# Patient Record
Sex: Female | Born: 1970 | Race: White | Hispanic: Yes | Smoking: Never smoker
Health system: Southern US, Community
[De-identification: ages and names within clinical notes are randomized; demographics above are authoritative.]

## PROBLEM LIST (undated history)

## (undated) DIAGNOSIS — I10 Essential (primary) hypertension: Secondary | ICD-10-CM

## (undated) DIAGNOSIS — E119 Type 2 diabetes mellitus without complications: Secondary | ICD-10-CM

## (undated) HISTORY — DX: Essential (primary) hypertension: I10

## (undated) HISTORY — DX: Type 2 diabetes mellitus without complications: E11.9

---

## 1994-12-15 HISTORY — PX: CHOLECYSTECTOMY: SHX55

## 2010-12-15 HISTORY — PX: THYROID SURGERY: SHX805

## 2021-12-26 ENCOUNTER — Other Ambulatory Visit: Payer: Self-pay | Admitting: Obstetrics and Gynecology

## 2021-12-26 DIAGNOSIS — Z1231 Encounter for screening mammogram for malignant neoplasm of breast: Secondary | ICD-10-CM

## 2022-02-12 ENCOUNTER — Ambulatory Visit
Admission: RE | Admit: 2022-02-12 | Discharge: 2022-02-12 | Disposition: A | Discharge status: Home / Self Care | Payer: No Typology Code available for payment source | Source: Ambulatory Visit | Attending: Obstetrics and Gynecology | Admitting: Obstetrics and Gynecology

## 2022-02-12 ENCOUNTER — Other Ambulatory Visit: Payer: Self-pay

## 2022-02-12 DIAGNOSIS — Z1231 Encounter for screening mammogram for malignant neoplasm of breast: Secondary | ICD-10-CM

## 2022-02-17 ENCOUNTER — Other Ambulatory Visit: Payer: Self-pay | Admitting: Obstetrics and Gynecology

## 2022-02-17 DIAGNOSIS — R928 Other abnormal and inconclusive findings on diagnostic imaging of breast: Secondary | ICD-10-CM

## 2022-03-20 ENCOUNTER — Inpatient Hospital Stay: Payer: Self-pay | Attending: Hematology and Oncology | Admitting: Hematology and Oncology

## 2022-03-20 VITALS — BP 125/70 | HR 98 | Temp 98.0°F | Resp 18 | Ht 59.0 in | Wt 142.9 lb

## 2022-03-20 DIAGNOSIS — Z124 Encounter for screening for malignant neoplasm of cervix: Secondary | ICD-10-CM

## 2022-03-21 NOTE — Progress Notes (Signed)
Kelly Owens is a 51 y.o. No obstetric history on file. female who presents to [facility] clinic today with no complaints right breast with calcification noted on mammogram.  ?  ?Pap Smear: Pap smear completed today. Last Pap smear was 2020 and was normal. Per patient has no history of an abnormal Pap smear. Last Pap smear result is not available in Epic. ?  ?Physical exam: ?Breasts ?Breasts symmetrical. No skin abnormalities bilateral breasts. No nipple retraction bilateral breasts. No nipple discharge bilateral breasts. No lymphadenopathy. No lumps palpated bilateral breasts.     ?  ?Pelvic/Bimanual ?Ext Genitalia ?No lesions, no swelling and no discharge observed on external genitalia.      ?  ?Vagina ?Vagina pink and normal texture. No lesions or discharge observed in vagina.      ?  ?Cervix ?Cervix is present. Cervix pink and of normal texture. No discharge observed.  ?  ?Uterus ?Uterus is present and palpable. Uterus in normal position and normal size.      ?  ?Adnexae ?Bilateral ovaries present and palpable. No tenderness on palpation.       ?  ?Rectovaginal ?No rectal exam completed today since patient had no rectal complaints. No skin abnormalities observed on exam.   ?  ?Smoking History: ?Patient has never smoked and was not referred to quit line.  ?  ?Patient Navigation: ?Patient education provided. Access to services provided for patient through BCCCP program. A [facility] interpreter provided. No transportation provided  ? ?Colorectal Cancer Screening: ?Per patient has never had colonoscopy completed No complaints today.  ?  ?Breast and Cervical Cancer Risk Assessment: ?Patient does not have family history of breast cancer, known genetic mutations, or radiation treatment to the chest before age 11. Patient does not have history of cervical dysplasia, immunocompromised, or DES exposure in-utero. ? ?Risk Assessment   ?No risk assessment data ?  ? ? ?A: ?BCCCP exam with pap smear ?Complaint of right  calcification noted on mammogram. ? ?P: ?[facility]. Appointment scheduled. ? ?Pascal Lux, NP ?03/21/2022 8:20 AM   ?

## 2022-03-25 LAB — CYTOLOGY - PAP
Comment: NEGATIVE
Diagnosis: UNDETERMINED — AB
High risk HPV: POSITIVE — AB

## 2022-04-02 ENCOUNTER — Encounter: Payer: Self-pay | Admitting: Hematology and Oncology

## 2022-04-14 ENCOUNTER — Other Ambulatory Visit: Payer: Self-pay | Admitting: Hematology and Oncology

## 2022-04-14 DIAGNOSIS — R921 Mammographic calcification found on diagnostic imaging of breast: Secondary | ICD-10-CM

## 2022-05-15 ENCOUNTER — Ambulatory Visit: Payer: No Typology Code available for payment source | Admitting: Obstetrics

## 2022-05-15 ENCOUNTER — Ambulatory Visit
Admission: RE | Admit: 2022-05-15 | Discharge: 2022-05-15 | Disposition: A | Discharge status: Home / Self Care | Payer: No Typology Code available for payment source | Source: Ambulatory Visit | Attending: Hematology and Oncology | Admitting: Hematology and Oncology

## 2022-05-15 DIAGNOSIS — R921 Mammographic calcification found on diagnostic imaging of breast: Secondary | ICD-10-CM

## 2022-05-20 ENCOUNTER — Ambulatory Visit: Payer: No Typology Code available for payment source | Admitting: Obstetrics and Gynecology

## 2022-06-27 ENCOUNTER — Ambulatory Visit: Payer: No Typology Code available for payment source | Admitting: Obstetrics

## 2022-07-09 ENCOUNTER — Ambulatory Visit: Payer: No Typology Code available for payment source | Admitting: Obstetrics and Gynecology

## 2022-08-14 ENCOUNTER — Ambulatory Visit: Payer: No Typology Code available for payment source | Admitting: Obstetrics and Gynecology

## 2022-08-22 ENCOUNTER — Ambulatory Visit (INDEPENDENT_AMBULATORY_CARE_PROVIDER_SITE_OTHER): Payer: Self-pay | Admitting: Family Medicine

## 2022-08-22 ENCOUNTER — Other Ambulatory Visit (HOSPITAL_COMMUNITY)
Admission: RE | Admit: 2022-08-22 | Discharge: 2022-08-22 | Disposition: A | Discharge status: Home / Self Care | Payer: Self-pay | Source: Ambulatory Visit | Attending: Obstetrics and Gynecology | Admitting: Obstetrics and Gynecology

## 2022-08-22 ENCOUNTER — Encounter: Payer: Self-pay | Admitting: Family Medicine

## 2022-08-22 VITALS — BP 133/77 | HR 48 | Ht 59.0 in | Wt 147.4 lb

## 2022-08-22 DIAGNOSIS — R87610 Atypical squamous cells of undetermined significance on cytologic smear of cervix (ASC-US): Secondary | ICD-10-CM | POA: Insufficient documentation

## 2022-08-22 DIAGNOSIS — Z800 Family history of malignant neoplasm of digestive organs: Secondary | ICD-10-CM

## 2022-08-22 DIAGNOSIS — R87810 Cervical high risk human papillomavirus (HPV) DNA test positive: Secondary | ICD-10-CM

## 2022-08-22 DIAGNOSIS — E119 Type 2 diabetes mellitus without complications: Secondary | ICD-10-CM | POA: Insufficient documentation

## 2022-08-22 NOTE — Progress Notes (Signed)
   GYNECOLOGY PROBLEM  VISIT ENCOUNTER NOTE  Subjective:   Kelly Owens is a 52 y.o. G7P2002  female here for a problem GYN visit.  Current complaints: here as referral from [facility] for abnormal pap.   Denies abnormal vaginal bleeding, discharge, pelvic pain, problems with intercourse or other gynecologic concerns.    Gynecologic History No LMP recorded. Patient is postmenopausal.  Contraception: post menopausal status  Health Maintenance Due  Topic Date Due   HEMOGLOBIN A1C  Never done   COVID-19 Vaccine (1) Never done   FOOT EXAM  Never done   OPHTHALMOLOGY EXAM  Never done   HIV Screening  Never done   Hepatitis C Screening  Never done   TETANUS/TDAP  Never done   COLONOSCOPY (Pts 45-7yrs Insurance coverage will need to be confirmed)  Never done   Zoster Vaccines- Shingrix (1 of 2) Never done   INFLUENZA VACCINE  Never done    The following portions of the patient's history were reviewed and updated as appropriate: allergies, current medications, past family history, past medical history, past social history, past surgical history and problem list.  Review of Systems Pertinent items are noted in HPI.   Objective:  BP 133/77   Pulse (!) 48   Ht 4\' 11"  (1.499 m)   Wt 147 lb 6.4 oz (66.9 kg)   BMI 29.77 kg/m  Gen: well appearing, NAD HEENT: no scleral icterus CV: RR Lung: Normal WOB Ext: warm well perfused  PELVIC: Normal appearing external genitalia; normal appearing vaginal mucosa and cervix.  No abnormal discharge noted. Normal uterine size, no other palpable masses, no uterine or adnexal tenderness.   51 y.o.  here for colposcopy for ASCUS with POSITIVE high risk HPV pap smear on 03/20/22. Discussed role for HPV in cervical dysplasia, need for surveillance.  Patient gave informed written consent, time out was performed.  Placed in lithotomy position. Cervix viewed with speculum and colposcope after application of acetic acid.   Colposcopy adequate? Yes   acetowhite lesion(s) noted at 12 and 3 o'clock; corresponding biopsies obtained.  ECC specimen obtained. All specimens were labeled and sent to pathology.  Chaperone was present during entire procedure.  Patient was given post procedure instructions.  Will follow up pathology and manage accordingly; patient will be contacted with results and recommendations.  Routine preventative health maintenance measures emphasized.   Assessment and Plan:  1. 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  - Surgical pathology( [place]/ [facility])  2. Family history of stomach cancer - had FOBT CRC screening - Ambulatory referral to Gastroenterology   Please refer to After Visit Summary for other counseling recommendations.   Return in about 1 year (around 08/23/2023) for Yearly wellness exam, pap.  10/23/2023, MD, MPH, ABFM Attending Physician Faculty Practice- [facility]

## 2022-08-22 NOTE — Progress Notes (Signed)
New gyn pt presents to establish care. Family hx of stomach cancer. Last PAP 03/20/2022, and was abnormal. Last mammogram June 2023 and was abnormal. No questions or concerns at this time.

## 2022-08-25 LAB — SURGICAL PATHOLOGY

## 2022-08-28 ENCOUNTER — Encounter: Payer: Self-pay | Admitting: Gastroenterology

## 2022-08-28 ENCOUNTER — Telehealth: Payer: Self-pay | Admitting: Emergency Medicine

## 2022-08-28 NOTE — Telephone Encounter (Signed)
Attempted TC x2 to home and mobile with spanish interpreter LVM.

## 2022-10-17 ENCOUNTER — Ambulatory Visit: Payer: No Typology Code available for payment source | Admitting: Gastroenterology

## 2023-01-14 ENCOUNTER — Encounter: Payer: Self-pay | Admitting: Obstetrics and Gynecology

## 2023-02-04 ENCOUNTER — Inpatient Hospital Stay: Payer: Self-pay | Attending: Hematology and Oncology | Admitting: Hematology and Oncology

## 2023-02-04 VITALS — BP 134/87 | Wt 152.6 lb

## 2023-02-04 DIAGNOSIS — R921 Mammographic calcification found on diagnostic imaging of breast: Secondary | ICD-10-CM

## 2023-02-04 NOTE — Patient Instructions (Addendum)
[place] about breast self awareness and gave educational materials to take home. Patient did not need a Pap smear today due to last Pap smear was in 2023 per patient. She is due for one year follow up of LEEP in September 2024. Let her know BCCCP will cover Pap smears every 5 years unless has a history of abnormal Pap smears. Referred patient to the [facility] for diagnostic mammogram. Patient aware of appointment and will be there. Let patient know will follow up with her within the next couple weeks with results. [place] verbalized understanding.  Melodye Ped, NP 10:26 AM

## 2023-02-04 NOTE — Progress Notes (Signed)
[name] is a 52 y.o. female who presents to [facility] clinic today with complaint of bilateral breast pain.    Pap Smear: Pap not smear completed today. Last Pap smear was 2023 and was abnormal - ASCUS/ HPV+ .  Colposcopy revealed HGSIL changes and patient had LEEP in September with recommendation for repeat Pap in one year. We will have her return in September. Per patient has no history of an abnormal Pap smear. Last Pap smear result is available in Epic.   Physical exam: Breasts Breasts symmetrical. No skin abnormalities bilateral breasts. No nipple retraction bilateral breasts. No nipple discharge bilateral breasts. No lymphadenopathy. No lumps palpated bilateral breasts.     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 This was found to be concordant by Dr. Lovey Newcomer. 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 This was found to be concordant by Dr. Lovey Newcomer. Pathology results were discussed with the patient by telephone by Kathrine Haddock, Bilingual Patient Services Representative. The patient reported doing well after the biopsies with tenderness at the sites. Post biopsy instructions and care were reviewed and questions were answered. The patient was encouraged to call [facility] for any additional concerns. The patient was instructed to return for annual screening mammography in March 2024. Pathology results reported by Terie Purser, RN on 05/16/2022. Electronically Signed   By: Lovey Newcomer M.D.   On: 05/26/2022 16:09   Result Date: 05/26/2022 CLINICAL DATA:  Patient with indeterminate  calcifications right breast 2 sites. EXAM: RIGHT BREAST STEREOTACTIC CORE NEEDLE BIOPSY COMPARISON:  [facility] FINDINGS: The patient and I discussed the procedure of stereotactic-guided biopsy including benefits and alternatives. We discussed the high likelihood of a successful procedure. We discussed the risks of the procedure including infection, bleeding, tissue injury, clip migration, and inadequate sampling. Informed written consent was given. The usual time out protocol was performed immediately prior to the procedure. Site 1: Lower inner right breast Using sterile technique and 1% Lidocaine as local anesthetic, under stereotactic guidance, a 9 gauge vacuum assisted device was used to perform core needle biopsy of calcifications lower inner right breast using a medial approach. Specimen radiograph was performed showing calcifications. Specimens with calcifications are identified for pathology. Lesion quadrant: Lower inner quadrant At the conclusion of the procedure, coil shaped tissue marker clip was deployed into the biopsy cavity. Follow-up 2-view mammogram was performed and dictated separately. Site 2: Upper-outer right breast Using sterile technique and 1% Lidocaine as local anesthetic, under stereotactic guidance, a 9 gauge vacuum assisted device was used to perform core needle biopsy of calcifications upper-outer right breast using a medial approach. Specimen radiograph was performed showing calcifications. Specimens with calcifications are identified for pathology. Lesion quadrant: Upper outer quadrant At the conclusion of the procedure, ribbon shaped tissue marker clip was deployed into the biopsy cavity. Follow-up 2-view mammogram was performed and dictated separately. IMPRESSION: Stereotactic-guided biopsy of right breast calcifications, 2 sites. No apparent complications. Electronically Signed: By: Lovey Newcomer M.D. On: 05/15/2022 12:42  MM RT BREAST BX W LOC DEV EA AD LESION IMG BX SPEC STEREO  GUIDE  Addendum Date: 05/26/2022   ADDENDUM REPORT: 05/26/2022 16:09 ADDENDUM: Pathology  revealed FIBROCYSTIC CHANGES WITH USUAL DUCTAL HYPERPLASIA AND CALCIFICATIONS, FOCAL FIBROADENOMATOID CHANGE WITH CALCIFICATIONS of the RIGHT breast, lower inner, (coil clip). This was found to be concordant by Dr. Lovey Newcomer. 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 This was found to be concordant by Dr. Lovey Newcomer. Pathology results were discussed with the patient by telephone by Kathrine Haddock, Bilingual Patient Services Representative. The patient reported doing well after the biopsies with tenderness at the sites. Post biopsy instructions and care were reviewed and questions were answered. The patient was encouraged to call [facility] for any additional concerns. The patient was instructed to return for annual screening mammography in March 2024. Pathology results reported by Terie Purser, RN on 05/16/2022. Electronically Signed   By: Lovey Newcomer M.D.   On: 05/26/2022 16:09   Result Date: 05/26/2022 CLINICAL DATA:  Patient with indeterminate calcifications right breast 2 sites. EXAM: RIGHT BREAST STEREOTACTIC CORE NEEDLE BIOPSY COMPARISON:  [facility] FINDINGS: The patient and I discussed the procedure of stereotactic-guided biopsy including benefits and alternatives. We discussed the high likelihood of a successful procedure. We discussed the risks of the procedure including infection, bleeding, tissue injury, clip migration, and inadequate sampling. Informed written consent was given. The usual time out protocol was performed immediately prior to the procedure. Site 1: Lower inner right breast Using sterile technique and 1% Lidocaine as local anesthetic, under stereotactic guidance, a 9 gauge vacuum assisted device was used to perform core needle biopsy of calcifications lower inner right breast  using a medial approach. Specimen radiograph was performed showing calcifications. Specimens with calcifications are identified for pathology. Lesion quadrant: Lower inner quadrant At the conclusion of the procedure, coil shaped tissue marker clip was deployed into the biopsy cavity. Follow-up 2-view mammogram was performed and dictated separately. Site 2: Upper-outer right breast Using sterile technique and 1% Lidocaine as local anesthetic, under stereotactic guidance, a 9 gauge vacuum assisted device was used to perform core needle biopsy of calcifications upper-outer right breast using a medial approach. Specimen radiograph was performed showing calcifications. Specimens with calcifications are identified for pathology. Lesion quadrant: Upper outer quadrant At the conclusion of the procedure, ribbon shaped tissue marker clip was deployed into the biopsy cavity. Follow-up 2-view mammogram was performed and dictated separately. IMPRESSION: Stereotactic-guided biopsy of right breast calcifications, 2 sites. No apparent complications. Electronically Signed: By: Lovey Newcomer M.D. On: 05/15/2022 12:42  MM CLIP PLACEMENT RIGHT  Result Date: 05/15/2022 CLINICAL DATA:  Patient with indeterminate right breast calcifications, 2 sites EXAM: 3D DIAGNOSTIC RIGHT MAMMOGRAM POST STEREOTACTIC BIOPSY COMPARISON:  Previous exam(s). FINDINGS: 3D Mammographic images were obtained following stereotactic guided biopsy of right breast calcifications. Site 1: Lower inner right breast: Coil shaped clip: In appropriate position. Site 2: Upper-outer right breast calcifications: Ribbon shaped clip: In appropriate position. IMPRESSION: Appropriate positioning of the biopsy marking clips as above. Final Assessment: Post Procedure Mammograms for Marker Placement Electronically Signed   By: Lovey Newcomer M.D.   On: 05/15/2022 12:50  MS DIGITAL SCREENING TOMO BILATERAL  Result Date: 02/14/2022 CLINICAL DATA:  Screening. EXAM: DIGITAL SCREENING  BILATERAL MAMMOGRAM WITH TOMOSYNTHESIS AND CAD TECHNIQUE: Bilateral screening digital craniocaudal and mediolateral oblique mammograms were obtained. Bilateral screening digital breast tomosynthesis was performed. The images were evaluated with computer-aided detection. COMPARISON:  None. ACR Breast Density Category b: There are scattered areas of fibroglandular density. FINDINGS: In the right breast, calcifications warrant further evaluation  with magnified views. In the left breast, no findings suspicious for malignancy. IMPRESSION: Further evaluation is suggested for calcifications in the right breast. RECOMMENDATION: Diagnostic mammogram of the right breast. (Code:[id number]) The patient will be contacted regarding the findings, and additional imaging will be scheduled. BI-RADS CATEGORY  0: Incomplete. Need additional imaging evaluation and/or prior mammograms for comparison. Electronically Signed   By: Marin Olp M.D.   On: 02/14/2022 12:55      Pelvic/Bimanual Pap is not indicated today    Smoking History: Patient has never smoked and was not referred to quit line.    Patient Navigation: Patient education provided. Access to services provided for patient through [employer] interpreter provided. No transportation provided   Colorectal Cancer Screening: Per patient has never had colonoscopy completed No complaints today. FIT test negative per [place]   Breast and Cervical Cancer Risk Assessment: Patient does not have family history of breast cancer, known genetic mutations, or radiation treatment to the chest before age 28. Patient has history of cervical dysplasia, immunocompromised, or DES exposure in-utero.  Risk Scores as of 02/04/2023     [place]           5-year 0.98 %   Lifetime 7.65 %   This patient is [place]/Latina but has no documented birth country, so the [place] used data from [place] patients to calculate their risk score. Document a birth country in the  Demographics activity for a more accurate score.         Last calculated by Claretha Cooper, CMA on 02/04/2023 at 10:18 AM        A: BCCCP exam without pap smear Complaint of bilateral breast pain with benign exam. History of right breast calcifications with benign biopsy.  P: [facility]. Appointment to be scheduled and patient will be notified.   Melodye Ped, NP 02/04/2023 10:23 AM

## 2023-03-02 ENCOUNTER — Encounter: Payer: Self-pay | Admitting: Hematology and Oncology

## 2023-06-03 IMAGING — MG MM BREAST BX W/ LOC DEV EA AD LESION IMAG BX SPEC STEREO GUIDE*R
3 series · 3 of 11 positions shown · non-contrast
Comparison: [facility]
COMPARISON: [facility]

Addendum:
CLINICAL DATA: Patient with indeterminate calcifications right
breast 2 sites.

EXAM:
RIGHT BREAST STEREOTACTIC CORE NEEDLE BIOPSY

[R ML]
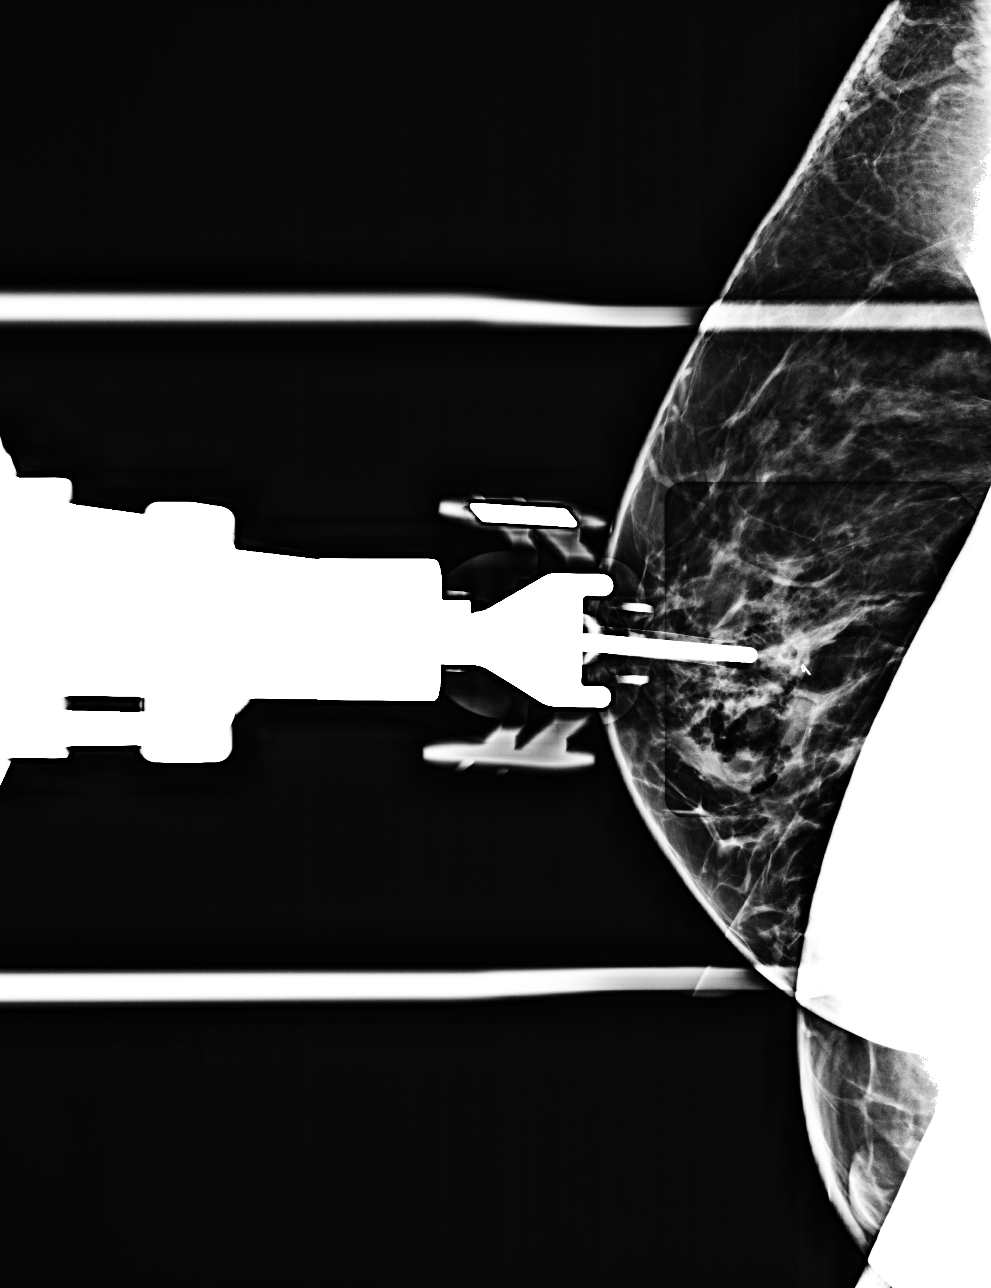

[R ML tomo (1 of 2) · tomo slice 28/55.0]
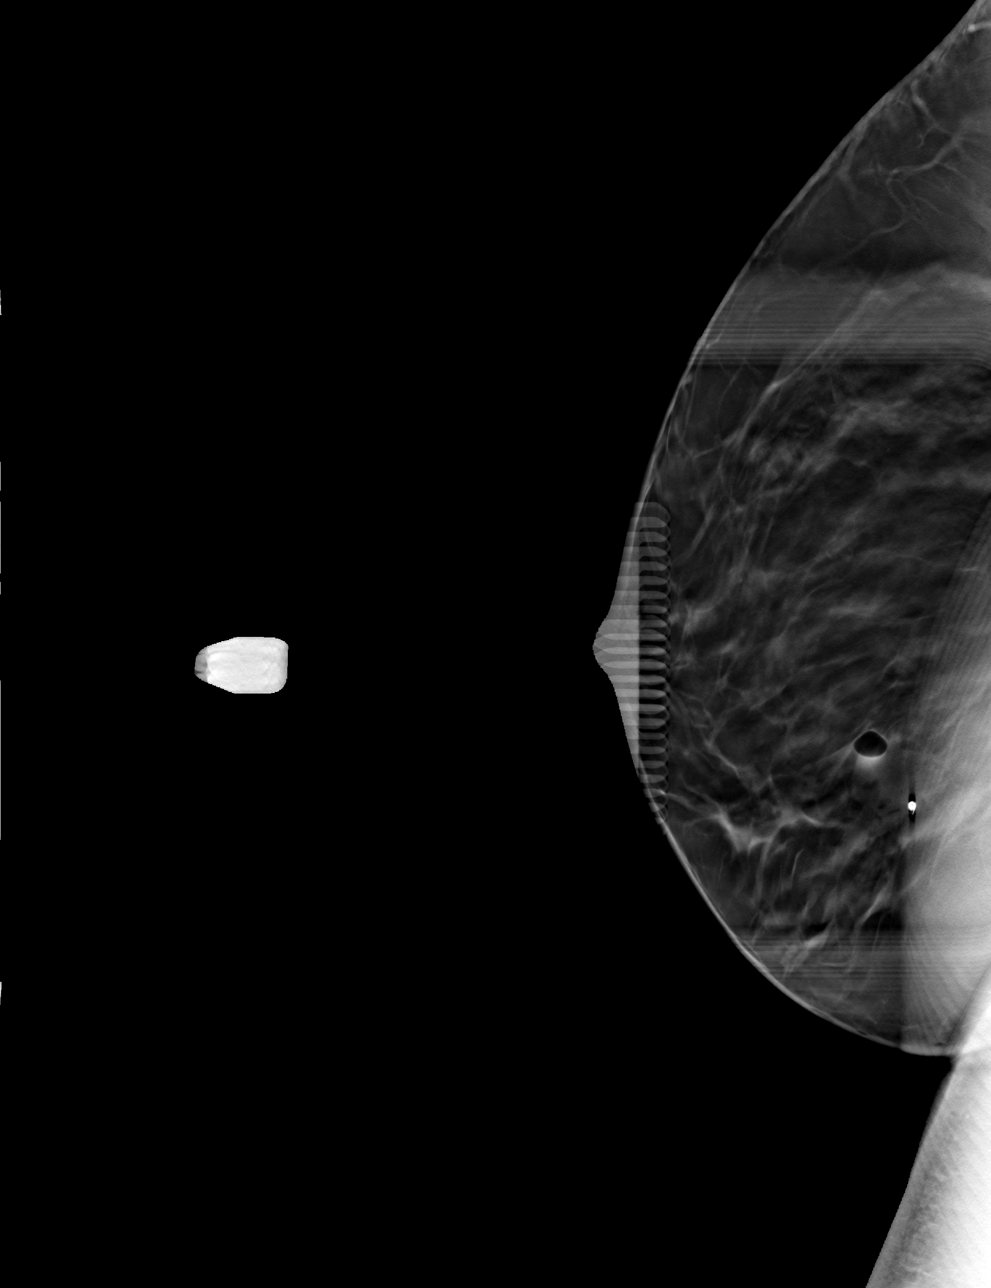

[R ML tomo (2 of 2) · tomo slice 27/54.0]
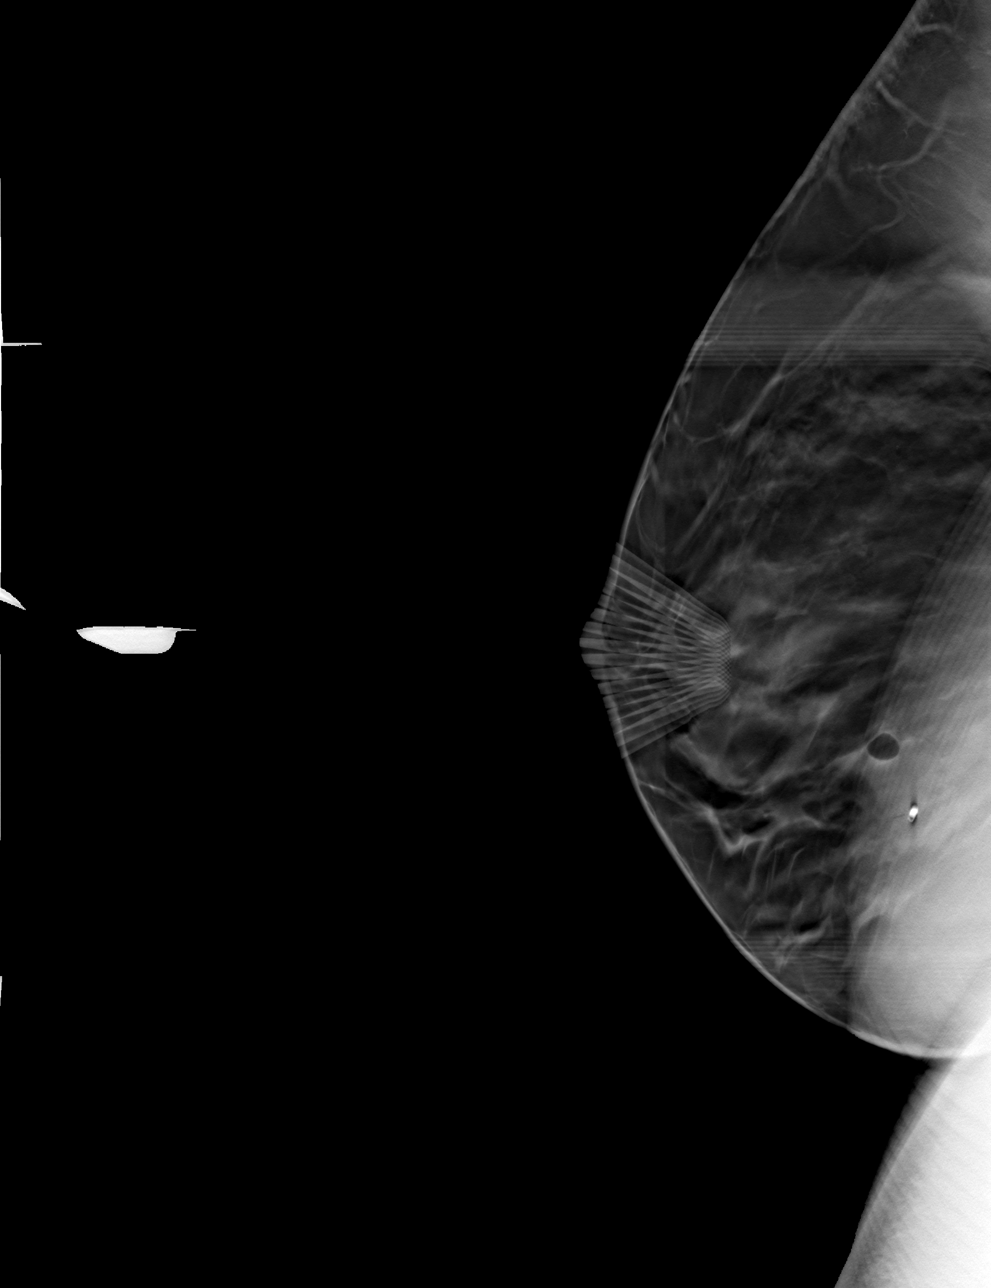

[3 of 11 positions shown; findings below may reference images not displayed]



Site 1: Lower inner right breast

Using sterile technique and 1% Lidocaine as local anesthetic, under
stereotactic guidance, a 9 gauge vacuum assisted device was used to
perform core needle biopsy of calcifications lower inner right
breast using a medial approach. Specimen radiograph was performed
showing calcifications. Specimens with calcifications are identified
for pathology.

Lesion quadrant: Lower inner quadrant

At the conclusion of the procedure, coil shaped tissue marker clip
was deployed into the biopsy cavity. Follow-up 2-view mammogram was
performed and dictated separately.

Site 2: Upper-outer right breast

Using sterile technique and 1% Lidocaine as local anesthetic, under
stereotactic guidance, a 9 gauge vacuum assisted device was used to
perform core needle biopsy of calcifications upper-outer right
breast using a medial approach. Specimen radiograph was performed
showing calcifications. Specimens with calcifications are identified
for pathology.

Lesion quadrant: Upper outer quadrant

At the conclusion of the procedure, ribbon shaped tissue marker clip
was deployed into the biopsy cavity. Follow-up 2-view mammogram was
performed and dictated separately.
IMPRESSION: Stereotactic-guided biopsy of right breast calcifications, 2 sites.
No apparent complications.

ADDENDUM:
Pathology revealed FIBROCYSTIC CHANGES WITH USUAL DUCTAL HYPERPLASIA
AND CALCIFICATIONS, FOCAL FIBROADENOMATOID CHANGE WITH
CALCIFICATIONS of the RIGHT breast, lower inner, (coil clip). This
was found to be concordant by Dr. Adip Amba.

Pathology revealed FIBROCYSTIC CHANGES WITH USUAL DUCTAL HYPERPLASIA
AND CALCIFICATIONS of the RIGHT breast, upper outer, (ribbon clip).
This was found to be concordant by Dr. Adip Amba.

Pathology results were discussed with the patient by telephone by
patient reported doing well after the biopsies with tenderness at
the sites. Post biopsy instructions and care were reviewed and
questions were answered. The patient was encouraged to call The

The patient was instructed to return for annual screening
mammography in February 2023.

Pathology results reported by Mereani Carpentier, RN on 05/16/2022.



Site 1: Lower inner right breast

Using sterile technique and 1% Lidocaine as local anesthetic, under
stereotactic guidance, a 9 gauge vacuum assisted device was used to
perform core needle biopsy of calcifications lower inner right
breast using a medial approach. Specimen radiograph was performed
showing calcifications. Specimens with calcifications are identified
for pathology.

Lesion quadrant: Lower inner quadrant

At the conclusion of the procedure, coil shaped tissue marker clip
was deployed into the biopsy cavity. Follow-up 2-view mammogram was
performed and dictated separately.

Site 2: Upper-outer right breast

Using sterile technique and 1% Lidocaine as local anesthetic, under
stereotactic guidance, a 9 gauge vacuum assisted device was used to
perform core needle biopsy of calcifications upper-outer right
breast using a medial approach. Specimen radiograph was performed
showing calcifications. Specimens with calcifications are identified
for pathology.

Lesion quadrant: Upper outer quadrant

At the conclusion of the procedure, ribbon shaped tissue marker clip
was deployed into the biopsy cavity. Follow-up 2-view mammogram was
performed and dictated separately.
IMPRESSION: Stereotactic-guided biopsy of right breast calcifications, 2 sites.
No apparent complications.

## 2023-06-03 IMAGING — MG MM BREAST LOCALIZATION CLIP
4 series · 4 of 12 positions shown · non-contrast
Comparison: Previous exam(s).

CLINICAL DATA: Patient with indeterminate right breast
calcifications, 2 sites

EXAM:
3D DIAGNOSTIC RIGHT MAMMOGRAM POST STEREOTACTIC BIOPSY

[R CC synth-2D]
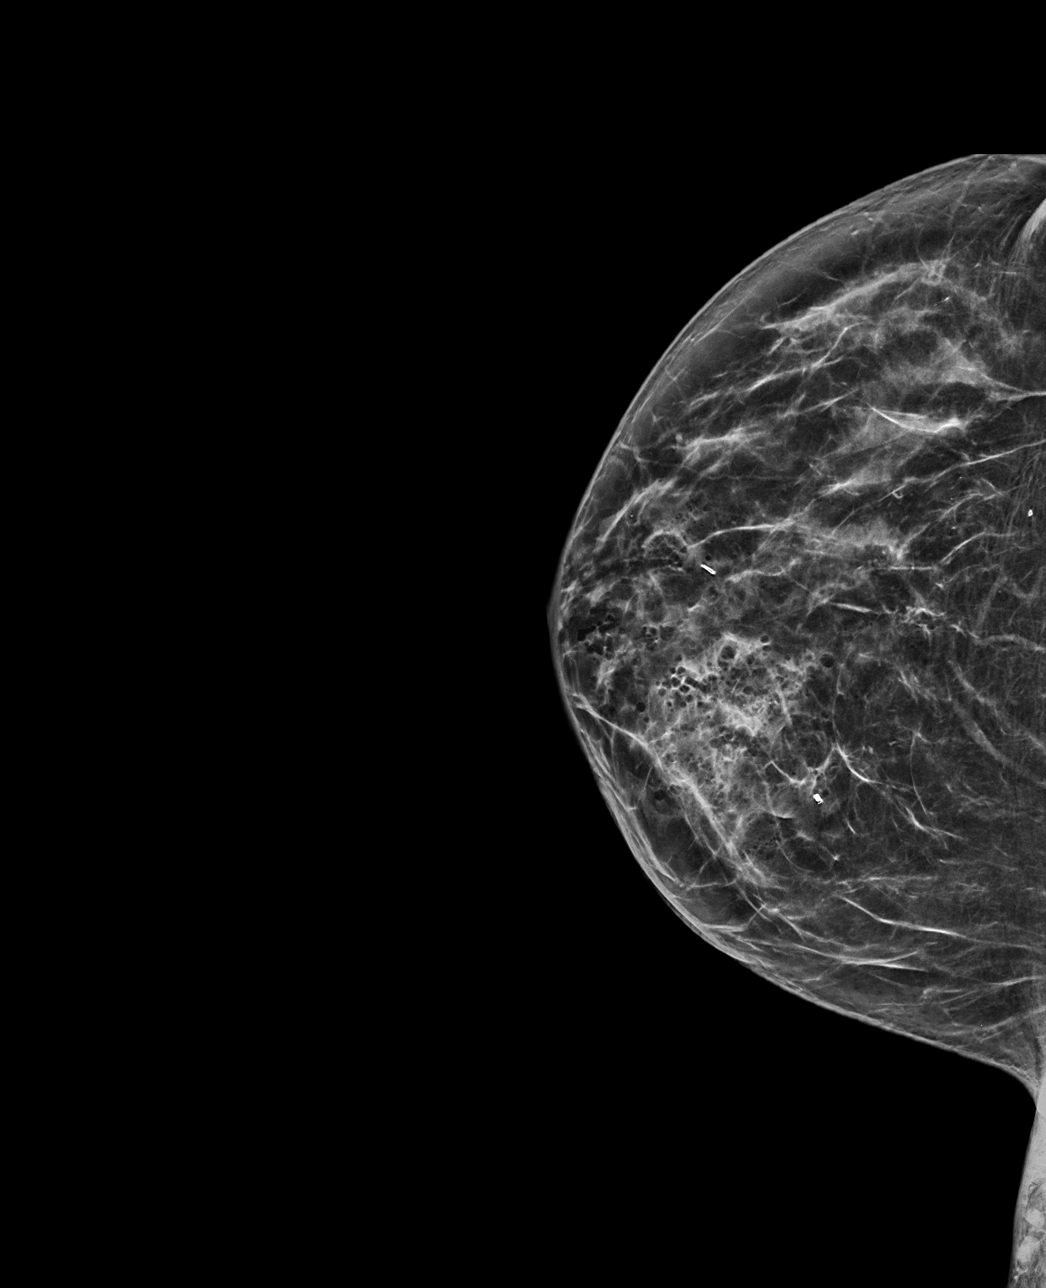

[R ML synth-2D]
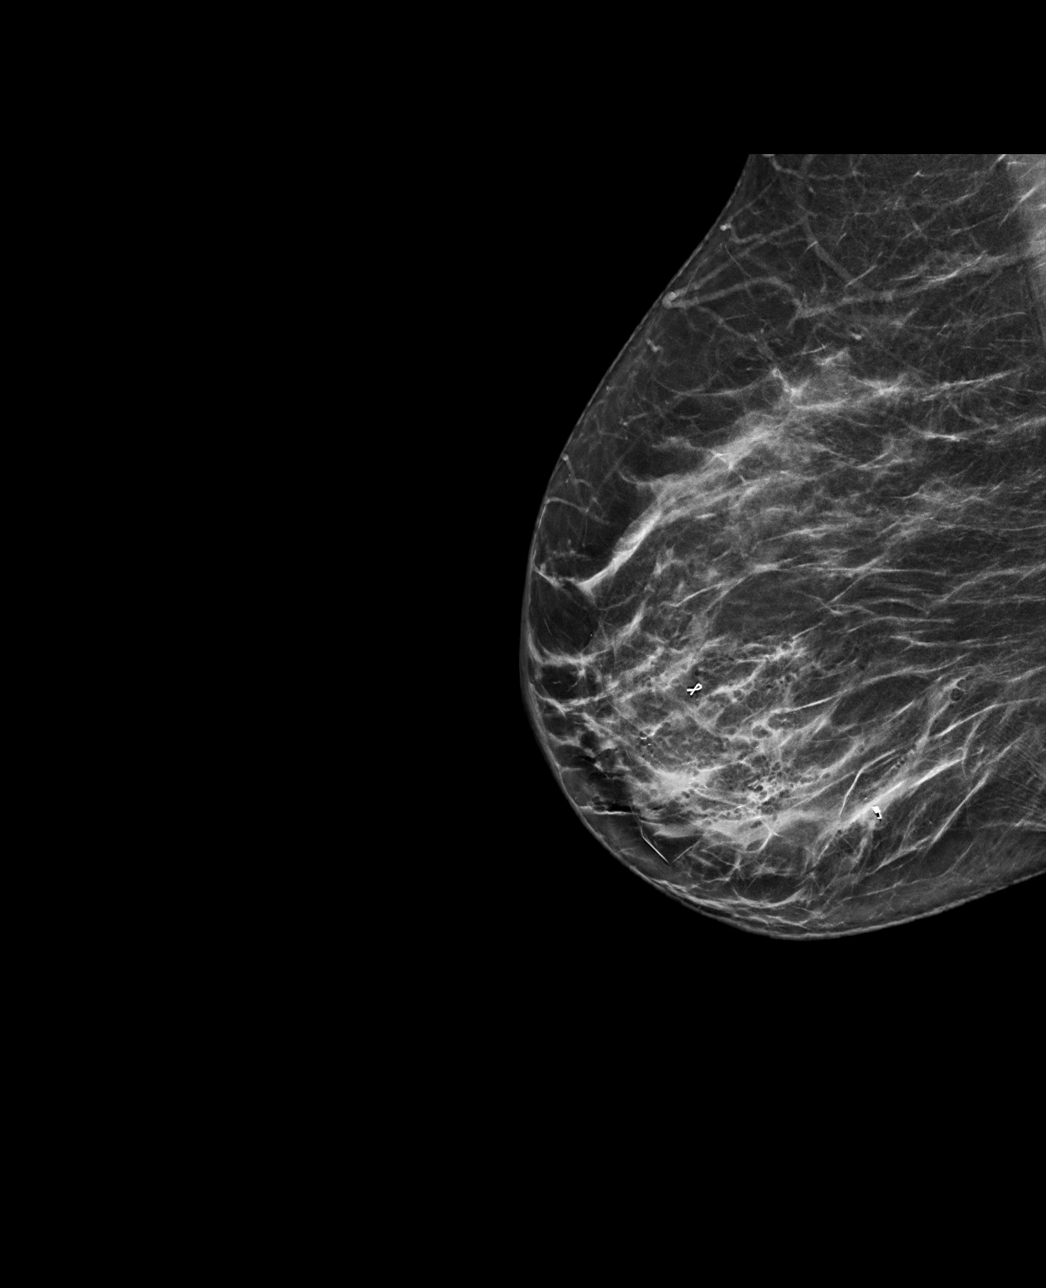

[R ML tomo · tomo slice 35/68.0]
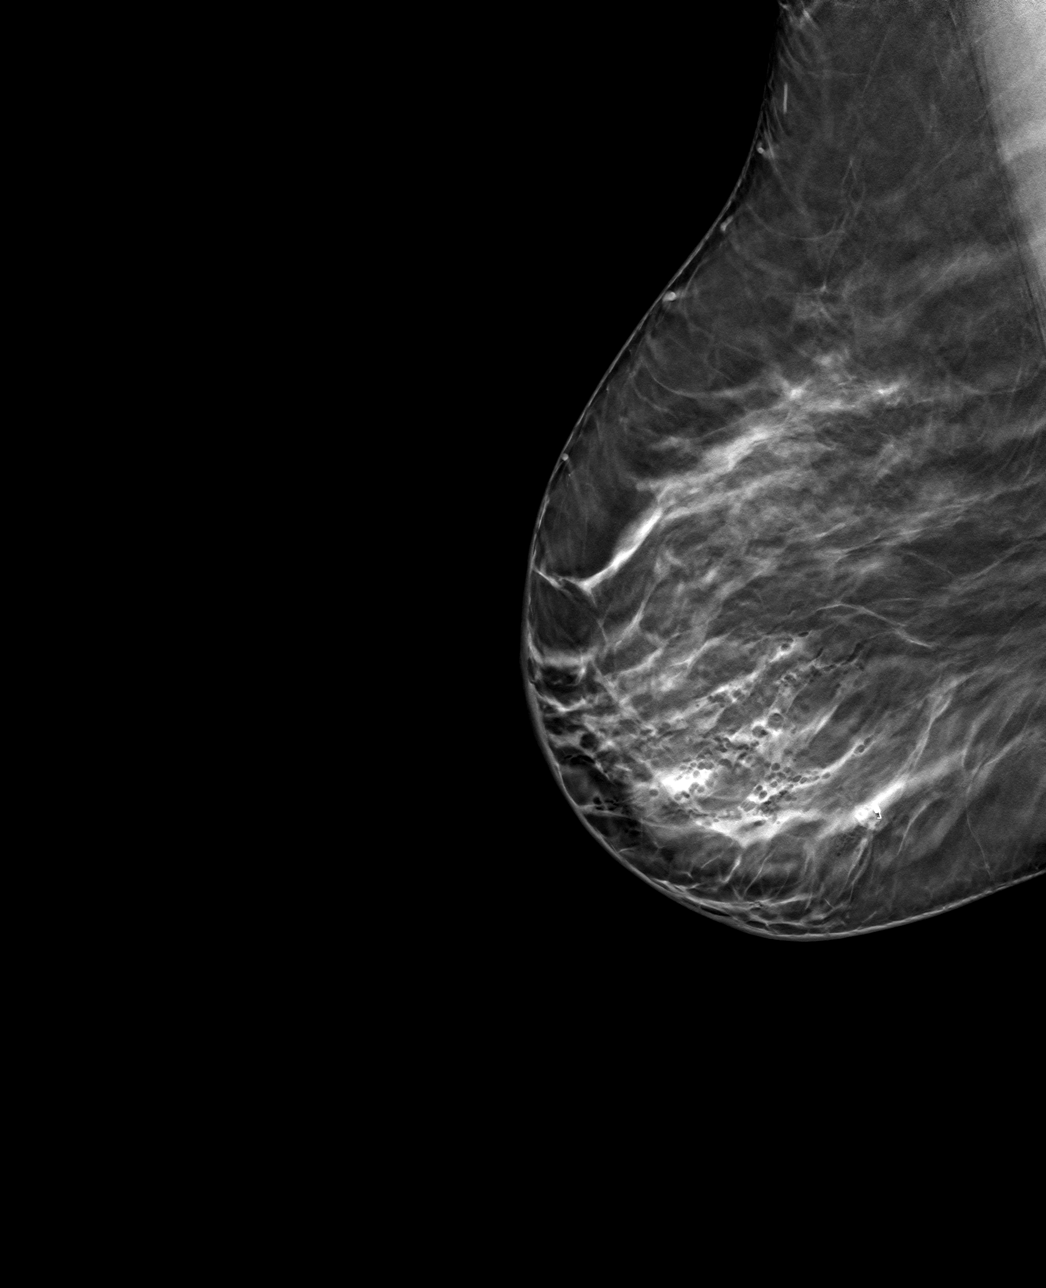

[R CC tomo · tomo slice 33/65.0]
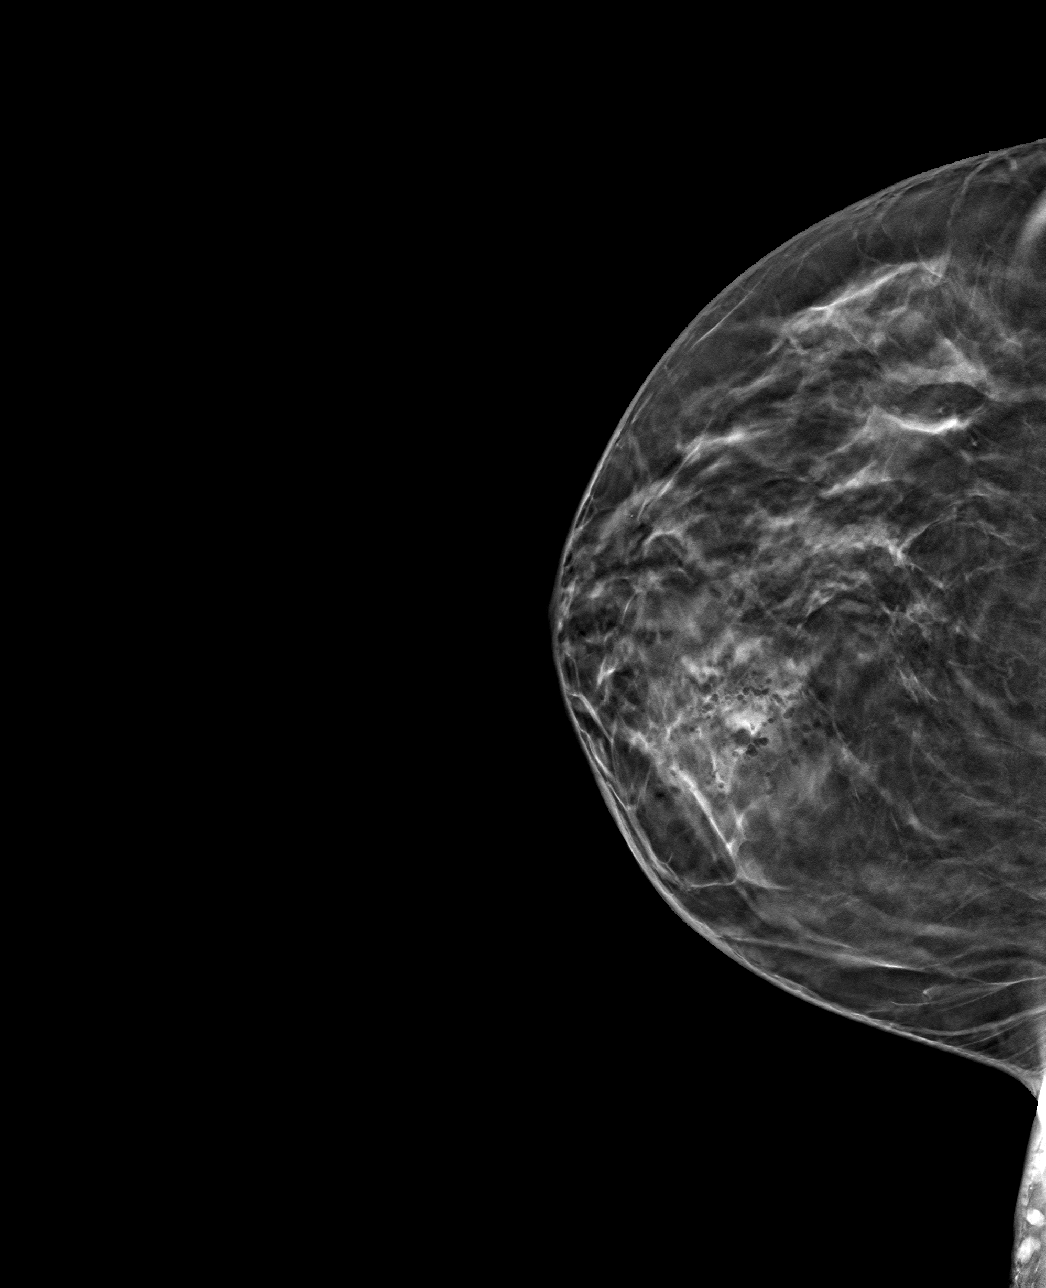

[4 of 12 positions shown; findings below may reference images not displayed]

FINDINGS: 3D Mammographic images were obtained following stereotactic guided
biopsy of right breast calcifications.

Site 1: Lower inner right breast: Coil shaped clip: In appropriate
position.

Site 2: Upper-outer right breast calcifications: Ribbon shaped clip:
In appropriate position.
IMPRESSION: Appropriate positioning of the biopsy marking clips as above.

Final Assessment: Post Procedure Mammograms for Marker Placement

## 2023-06-03 IMAGING — MG MM BREAST BX W/ LOC DEV 1ST LESION IMAGE BX SPEC STEREO GUIDE*R*
7 of 10 series · 7 of 18 positions shown · non-contrast
Comparison: [facility]
COMPARISON: [facility]

Addendum:
CLINICAL DATA: Patient with indeterminate calcifications right
breast 2 sites.

EXAM:
RIGHT BREAST STEREOTACTIC CORE NEEDLE BIOPSY

[R (1 of 7)]
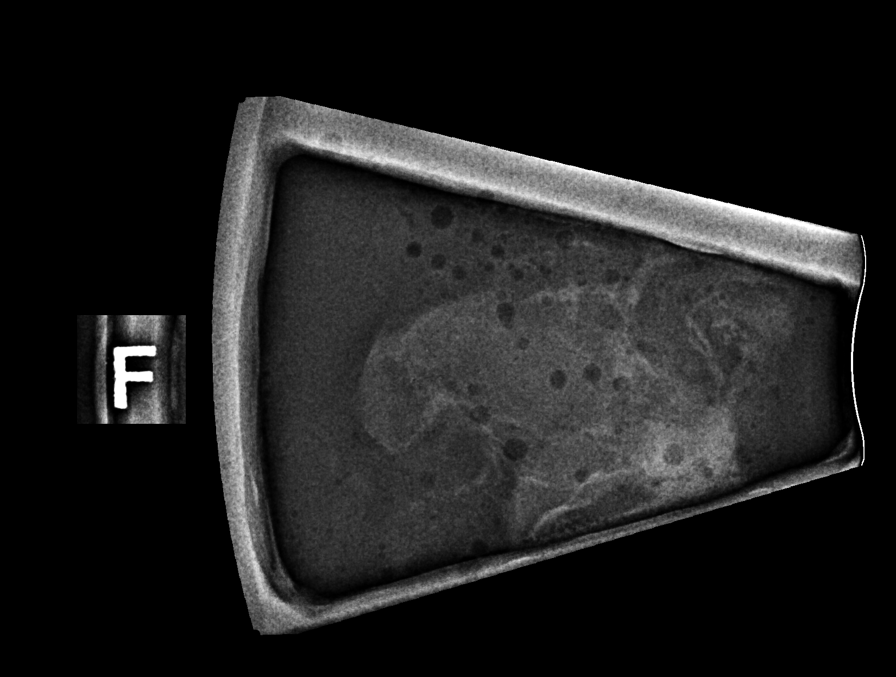

[R (2 of 7)]
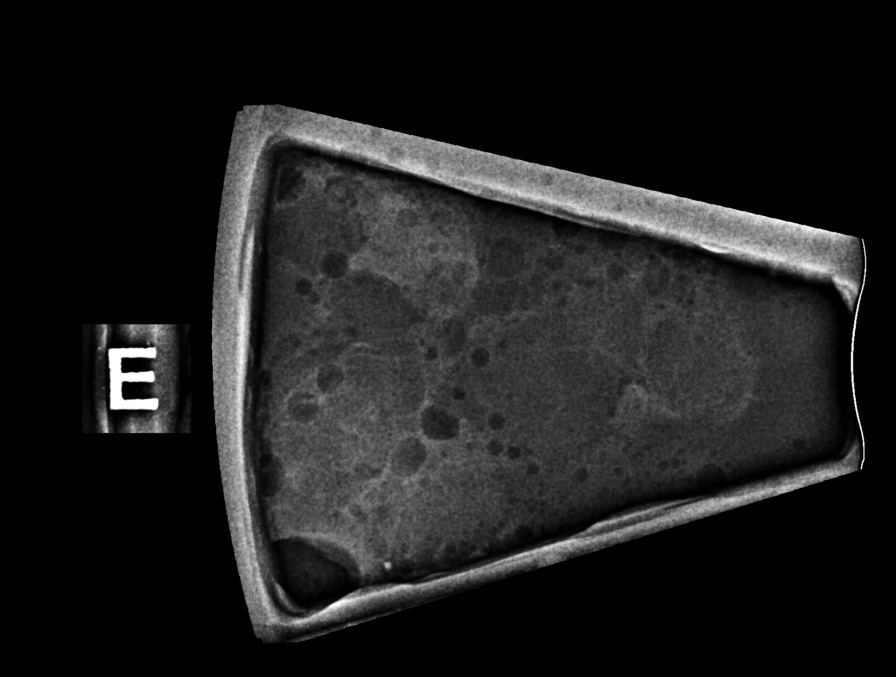

[R (3 of 7)]
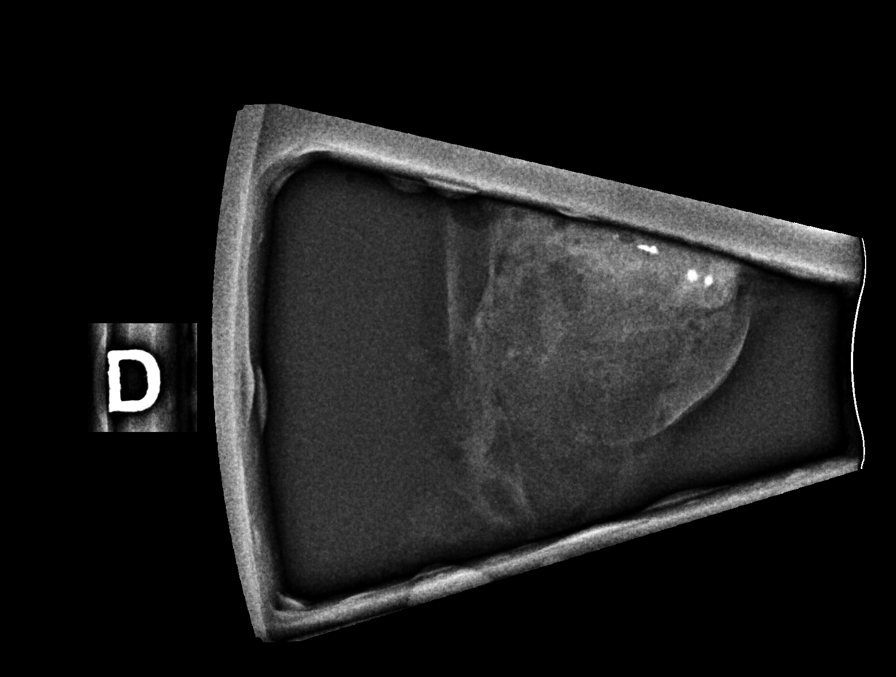

[R (4 of 7)]
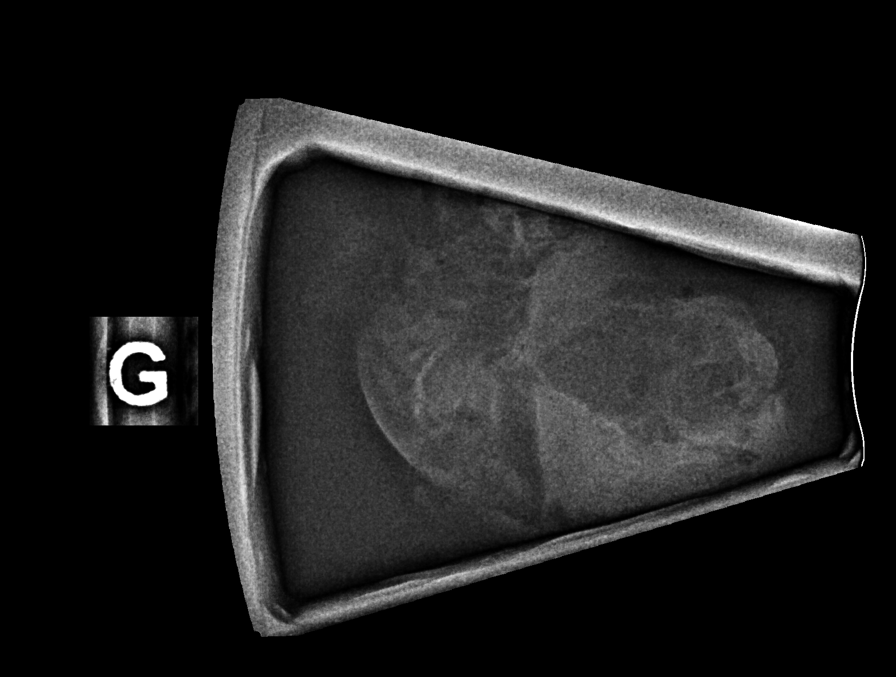

[R (5 of 7)]
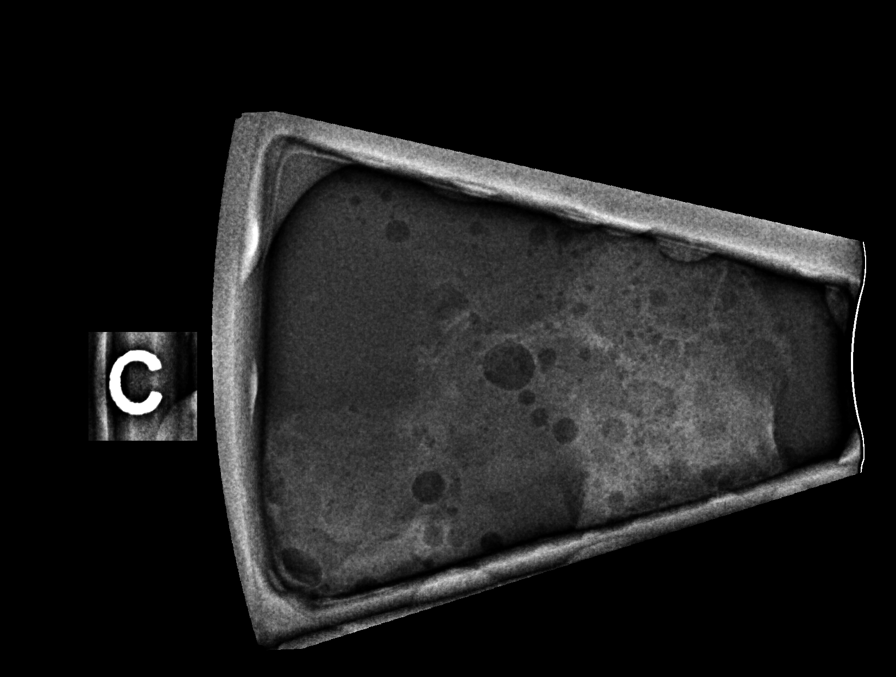

[R (6 of 7)]
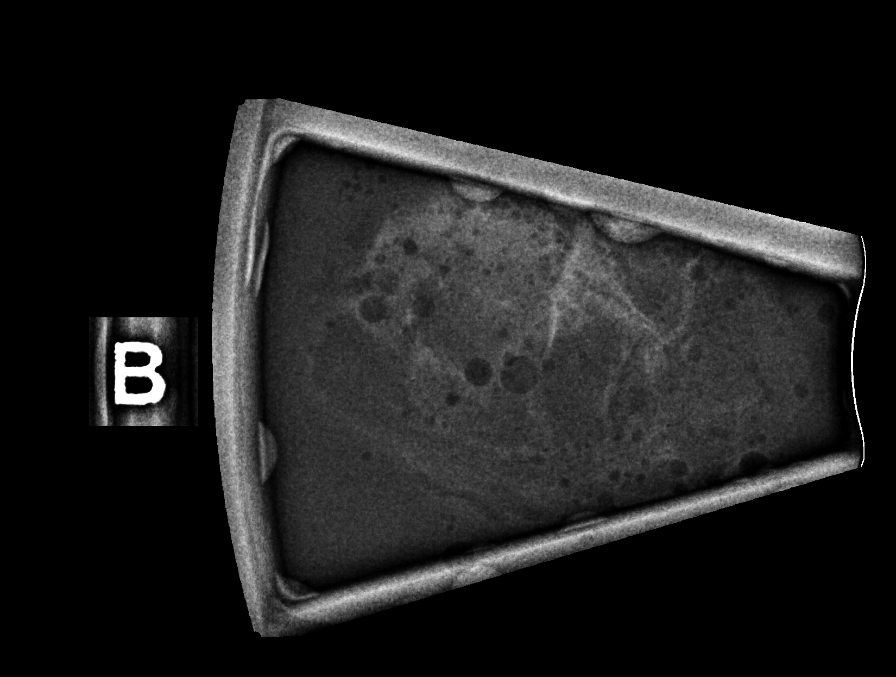

[R (7 of 7)]
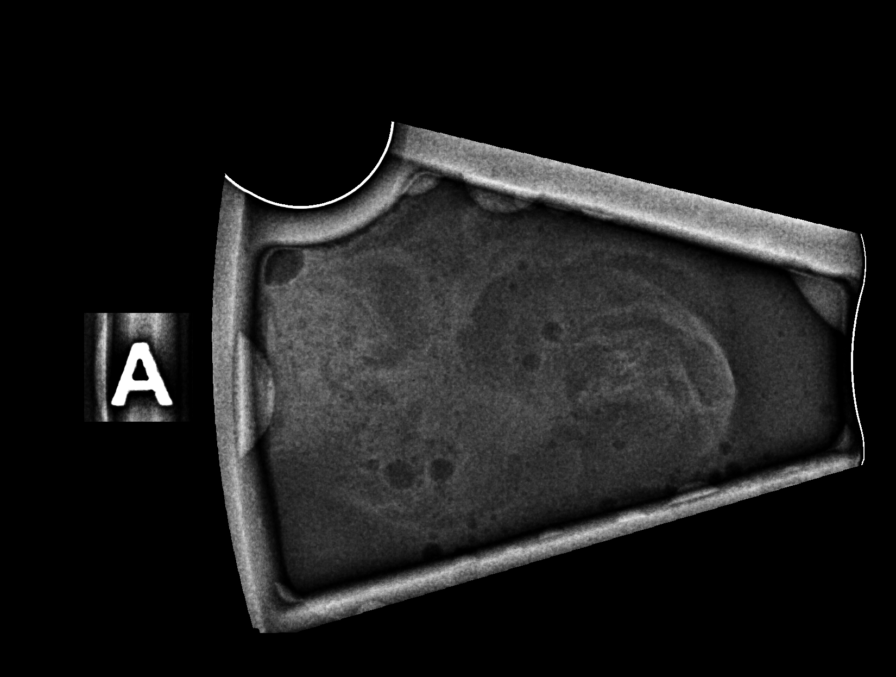

[7 of 18 positions shown; findings below may reference images not displayed]



Site 1: Lower inner right breast

Using sterile technique and 1% Lidocaine as local anesthetic, under
stereotactic guidance, a 9 gauge vacuum assisted device was used to
perform core needle biopsy of calcifications lower inner right
breast using a medial approach. Specimen radiograph was performed
showing calcifications. Specimens with calcifications are identified
for pathology.

Lesion quadrant: Lower inner quadrant

At the conclusion of the procedure, coil shaped tissue marker clip
was deployed into the biopsy cavity. Follow-up 2-view mammogram was
performed and dictated separately.

Site 2: Upper-outer right breast

Using sterile technique and 1% Lidocaine as local anesthetic, under
stereotactic guidance, a 9 gauge vacuum assisted device was used to
perform core needle biopsy of calcifications upper-outer right
breast using a medial approach. Specimen radiograph was performed
showing calcifications. Specimens with calcifications are identified
for pathology.

Lesion quadrant: Upper outer quadrant

At the conclusion of the procedure, ribbon shaped tissue marker clip
was deployed into the biopsy cavity. Follow-up 2-view mammogram was
performed and dictated separately.
IMPRESSION: Stereotactic-guided biopsy of right breast calcifications, 2 sites.
No apparent complications.

ADDENDUM:
Pathology revealed FIBROCYSTIC CHANGES WITH USUAL DUCTAL HYPERPLASIA
AND CALCIFICATIONS, FOCAL FIBROADENOMATOID CHANGE WITH
CALCIFICATIONS of the RIGHT breast, lower inner, (coil clip). This
was found to be concordant by Dr. Adip Amba.

Pathology revealed FIBROCYSTIC CHANGES WITH USUAL DUCTAL HYPERPLASIA
AND CALCIFICATIONS of the RIGHT breast, upper outer, (ribbon clip).
This was found to be concordant by Dr. Adip Amba.

Pathology results were discussed with the patient by telephone by
patient reported doing well after the biopsies with tenderness at
the sites. Post biopsy instructions and care were reviewed and
questions were answered. The patient was encouraged to call The

The patient was instructed to return for annual screening
mammography in February 2023.

Pathology results reported by Mereani Carpentier, RN on 05/16/2022.



Site 1: Lower inner right breast

Using sterile technique and 1% Lidocaine as local anesthetic, under
stereotactic guidance, a 9 gauge vacuum assisted device was used to
perform core needle biopsy of calcifications lower inner right
breast using a medial approach. Specimen radiograph was performed
showing calcifications. Specimens with calcifications are identified
for pathology.

Lesion quadrant: Lower inner quadrant

At the conclusion of the procedure, coil shaped tissue marker clip
was deployed into the biopsy cavity. Follow-up 2-view mammogram was
performed and dictated separately.

Site 2: Upper-outer right breast

Using sterile technique and 1% Lidocaine as local anesthetic, under
stereotactic guidance, a 9 gauge vacuum assisted device was used to
perform core needle biopsy of calcifications upper-outer right
breast using a medial approach. Specimen radiograph was performed
showing calcifications. Specimens with calcifications are identified
for pathology.

Lesion quadrant: Upper outer quadrant

At the conclusion of the procedure, ribbon shaped tissue marker clip
was deployed into the biopsy cavity. Follow-up 2-view mammogram was
performed and dictated separately.
IMPRESSION: Stereotactic-guided biopsy of right breast calcifications, 2 sites.
No apparent complications.

## 2023-08-26 ENCOUNTER — Telehealth: Payer: Self-pay | Admitting: Hematology and Oncology

## 2023-10-28 ENCOUNTER — Inpatient Hospital Stay: Payer: Medicaid Other | Attending: Hematology and Oncology
